# Patient Record
Sex: Male | Born: 1980 | Race: Black or African American | Hispanic: No | Marital: Single | Smoking: Current every day smoker
Health system: Southern US, Community
[De-identification: ages and names within clinical notes are randomized; demographics above are authoritative.]

## PROBLEM LIST (undated history)

## (undated) HISTORY — PX: FOREIGN BODY REMOVAL: SHX962

---

## 2010-05-02 ENCOUNTER — Ambulatory Visit (HOSPITAL_COMMUNITY): Admission: RE | Admit: 2010-05-02 | Discharge: 2010-05-02 | Payer: Self-pay | Admitting: Orthopaedic Surgery

## 2011-02-24 LAB — URINALYSIS, ROUTINE W REFLEX MICROSCOPIC
Bilirubin Urine: NEGATIVE
Hgb urine dipstick: NEGATIVE

## 2012-04-12 ENCOUNTER — Emergency Department (HOSPITAL_COMMUNITY): Payer: Self-pay

## 2012-04-12 ENCOUNTER — Emergency Department (HOSPITAL_COMMUNITY)
Admission: EM | Admit: 2012-04-12 | Discharge: 2012-04-13 | Disposition: A | Discharge status: Home / Self Care | Payer: Self-pay | Attending: Emergency Medicine | Admitting: Emergency Medicine

## 2012-04-12 ENCOUNTER — Encounter (HOSPITAL_COMMUNITY): Payer: Self-pay | Admitting: *Deleted

## 2012-04-12 DIAGNOSIS — M25469 Effusion, unspecified knee: Secondary | ICD-10-CM | POA: Insufficient documentation

## 2012-04-12 DIAGNOSIS — S8991XA Unspecified injury of right lower leg, initial encounter: Secondary | ICD-10-CM

## 2012-04-12 DIAGNOSIS — M25569 Pain in unspecified knee: Secondary | ICD-10-CM | POA: Insufficient documentation

## 2012-04-12 MED ORDER — IBUPROFEN 800 MG PO TABS
800.0000 mg | ORAL_TABLET | Freq: Once | ORAL | Status: AC
  Administered 2012-04-12: 800 mg via ORAL
  Filled 2012-04-12: qty 1

## 2012-04-12 NOTE — ED Provider Notes (Signed)
History   This chart was scribed for Sunnie Nielsen, MD by Brooks Sailors. The patient was seen in room APA18/[facility]18. Patient's care was started at 2250.   CSN: [id number]  Arrival date & time 04/12/12  2250   First MD Initiated Contact with Patient 04/12/12 2302      Chief Complaint  Patient presents with  . Knee Pain    (Consider location/radiation/quality/duration/timing/severity/associated sxs/prior treatment) Patient is a 31 y.o. male presenting with knee pain.  Knee Pain This is a new problem. The current episode started 12 to 24 hours ago. The problem occurs constantly. The problem has not changed since onset.Pertinent negatives include no chest pain, no abdominal pain and no shortness of breath. The symptoms are aggravated by walking and exertion. The symptoms are relieved by nothing. He has tried nothing for the symptoms. The treatment provided no relief.    MANNING LUNA is a 31 y.o. male who presents to the Emergency Department complaining of constant right knee pain onset 2 days ago following an injury. Patient was standing the other day and then turned when he heard a "pop" and has been swollen and sore ever since. Patient says pain is worst in the morning. Patient says the pain is sharp and that it radiates to the calf. Patient denies any previous injuries to the right knee and numbness in the RLE  History reviewed. No pertinent past medical history.  Past Surgical History  Procedure Date  . Foreign body removal     History reviewed. No pertinent family history.  History  Substance Use Topics  . Smoking status: Current Everyday Smoker -- 0.5 packs/day    Types: Cigarettes  . Smokeless tobacco: Not on file  . Alcohol Use: No      Review of Systems  Respiratory: Negative for shortness of breath.   Cardiovascular: Negative for chest pain.  Gastrointestinal: Negative for abdominal pain.  All other systems reviewed and are negative.    Allergies  Review of  patient's allergies indicates no known allergies.  Home Medications  No current outpatient prescriptions on file.  BP 126/65  Pulse 75  Temp 98.7 F (37.1 C)  Resp 18  Ht 5\' 9"  (1.753 m)  Wt 180 lb (81.647 kg)  BMI 26.58 kg/m2  SpO2 99%  Physical Exam  Nursing note and vitals reviewed. Constitutional: He is oriented to person, place, and time. He appears well-developed and well-nourished.  HENT:  Head: Normocephalic and atraumatic.  Eyes: Conjunctivae and EOM are normal. Pupils are equal, round, and reactive to light.  Neck: Normal range of motion. Neck supple.  Cardiovascular: Normal rate and regular rhythm.   Pulmonary/Chest: Effort normal and breath sounds normal.  Abdominal: Soft. Bowel sounds are normal.  Musculoskeletal: Normal range of motion. He exhibits tenderness (right knee).       Patient tender in right knee medial and lateral aspects. No joint effusion, No increased warmth, Skin intact, Non tender proximal fibula, ankle, and hip.   Neurological: He is alert and oriented to person, place, and time.  Skin: Skin is warm and dry.  Psychiatric: He has a normal mood and affect.    ED Course  Procedures (including critical care time) DIAGNOSTIC STUDIES: Oxygen Saturation is 99% on room air, normal by my interpretation.    COORDINATION OF CARE: 11:34PM Patient to get x-ray of knee, antiinflammatory, and pain medication.   Dg Knee Complete 4 Views Right  04/13/2012  *RADIOLOGY REPORT*  Clinical Data: Right-sided knee pain, status  post knee "pop".  RIGHT KNEE - COMPLETE 4+ VIEW  Comparison: None.  Findings: There is no evidence of fracture or dislocation.  The joint spaces are preserved.  No significant degenerative change is seen; the patellofemoral joint is grossly unremarkable in appearance.  A moderate knee joint effusion is noted.  The visualized soft tissues are otherwise grossly unremarkable.  IMPRESSION:  1.  No evidence of fracture or dislocation. 2.  Moderate  knee joint effusion noted.  If the patient's symptoms persist, MRI of the right knee could be considered for further evaluation, to exclude internal derangement of the knee.  Original Report Authenticated By: Tonia Ghent, M.D.   Gustavus Bryant. Pain medications. X-ray obtained and reviewed as above.  12:40 AM on recheck, pain is much improved. I offered knee joint aspiration and patient declined. He prefers to followup with orthopedics as an outpatient.  MDM  Right knee pain and swelling after twisting injury where he heard a pop. Knee effusion on x-rays above.  Knee immobilizer and crutches provided with orthopedic referral. Stable for discharge home. Mechanism does not suggest risk for tibial plateau fracture. Presentation more concerning for ligament or meniscal injury.   I personally performed the services described in this documentation, which was scribed in my presence. The recorded information has been reviewed and considered.     Sunnie Nielsen, MD 04/13/12 [phone]

## 2012-04-12 NOTE — ED Notes (Signed)
Pt reports standing and turning and then feeling knee "pop".  Pt reports injury happened 2-3 days ago.  Reports pain and swelling in right knee.  Has been treating at home with IcyHot, with no relief.  Denies taking pain medications or antiinflammatories.

## 2012-04-13 MED ORDER — IBUPROFEN 800 MG PO TABS: 800.0000 mg | ORAL_TABLET | Freq: Three times a day (TID) | ORAL | Status: AC

## 2012-04-13 NOTE — ED Notes (Signed)
Knee wrapped and crutches provided.  Crutch teaching done.  Pt reporting improvement in pain level.

## 2012-04-13 NOTE — Discharge Instructions (Signed)
Knee Effusion The medical term for having fluid in your knee is effusion. This is often due to an internal derangement of the knee. This means something is wrong inside the knee. Some of the causes of fluid in the knee may be torn cartilage, a torn ligament, or bleeding into the joint from an injury. Your knee is likely more difficult to bend and move. This is often because there is increased pain and pressure in the joint. The time it takes for recovery from a knee effusion depends on different factors, including:   Type of injury.   Your age.   Physical and medical conditions.   Rehabilitation Strategies.  How long you will be away from your normal activities will depend on what kind of knee problem you have and how much damage is present. Your knee has two types of cartilage. Articular cartilage covers the bone ends and lets your knee bend and move smoothly. Two menisci, thick pads of cartilage that form a rim inside the joint, help absorb shock and stabilize your knee. Ligaments bind the bones together and support your knee joint. Muscles move the joint, help support your knee, and take stress off the joint itself. CAUSES  Often an effusion in the knee is caused by an injury to one of the menisci. This is often a tear in the cartilage. Recovery after a meniscus injury depends on how much meniscus is damaged and whether you have damaged other knee tissue. Small tears may heal on their own with conservative treatment. Conservative means rest, limited weight bearing activity and muscle strengthening exercises. Your recovery may take up to 6 weeks.  TREATMENT  Larger tears may require surgery. Meniscus injuries may be treated during arthroscopy. Arthroscopy is a procedure in which your surgeon uses a small telescope like instrument to look in your knee. Your caregiver can make a more accurate diagnosis (learning what is wrong) by performing an arthroscopic procedure. If your injury is on the inner  margin of the meniscus, your surgeon may trim the meniscus back to a smooth rim. In other cases your surgeon will try to repair a damaged meniscus with stitches (sutures). This may make rehabilitation take longer, but may provide better long term result by helping your knee keep its shock absorption capabilities. Ligaments which are completely torn usually require surgery for repair. HOME CARE INSTRUCTIONS  Use crutches as instructed.   If a brace is applied, use as directed.   Once you are home, an ice pack applied to your swollen knee may help with discomfort and help decrease swelling.   Keep your knee raised (elevated) when you are not up and around or on crutches.   Only take over-the-counter or prescription medicines for pain, discomfort, or fever as directed by your caregiver.   Your caregivers will help with instructions for rehabilitation of your knee. This often includes strengthening exercises.   You may resume a normal diet and activities as directed.  SEEK MEDICAL CARE IF:   There is increased swelling in your knee.   You notice redness, swelling, or increasing pain in your knee.   An unexplained oral temperature above 102 F (38.9 C) develops.  SEEK IMMEDIATE MEDICAL CARE IF:   You develop a rash.   You have difficulty breathing.   You have any allergic reactions from medications you may have been given.   There is severe pain with any motion of the knee.  MAKE SURE YOU:   Understand these instructions.  Will watch your condition.   Will get help right away if you are not doing well or get worse.    Knee Wraps (Elastic Bandage) and RICE Knee wraps come in many different shapes and sizes and perform many different functions. Some wraps may provide cold therapy or warmth. Your caregiver will help you to determine what is best for your protection, or recovery following your injury. The following are some general tips to help you use a knee wrap:  Use the wrap  as directed.   Do not keep the wrap so tight that it cuts off the circulation of the leg below the wrap.   If your lower leg becomes blue, loses feeling, or becomes swollen below the wrap, it is probably too tight. Loosen the wrap as needed to improve these problems.   See your caregiver or trainer if the wrap seems to be making your problems worse rather than better.  Wraps in general help to remind you that you have an injury. They provide limited support. The few pounds of support they provide are minimal considering the hundreds of pounds of pressure it takes to injure a joint or tear ligaments.  The routine care of many injuries includes Rest, Ice, Compression, and Elevation (RICE).  Rest is required to allow your body to heal. Generally following bumps and bruises, routine activities can be resumed when comfortable. Injured tendons (cord-like structures that attach muscle to bone) and bones take approximately 6 to 12 weeks to heal.   Ice following an injury helps keep the swelling down and reduces pain. Do not apply ice directly to skin. Apply ice bags for 20-30 minutes every 3-4 hours for the first 2-3 days following injury or surgery. Place ice in a plastic bag with a towel around it.   Compression helps keep swelling down, gives support, and helps with discomfort. If a knee wrap has been applied, it should be removed and reapplied every 3 to 4 hours. It should be applied firmly enough to keep swelling down, but not too tightly. Watch your lower leg and toes for swelling, bluish discoloration, coldness, numbness or excessive pain. If any of these symptoms (problems) occur, remove the knee wrap and reapply more loosely. If these symptoms persist, contact your caregiver immediately.   Elevation helps reduce swelling, and decreases pain. With extremities (arms/hands and legs/feet), the injured area should be placed near to or above the level of the heart if possible.  Persistent pain and  inability to use the injured area for more than 2 to 3 days are warning signs indicating that you should see a caregiver for a follow-up visit as soon as possible. Initially, a hairline fracture (this is the same as a broken bone) may not be seen on x-rays.  Persistent pain and swelling mean limitedweight bearing (use of crutches as instructed) should continue. You may need further x-rays.  X-rays may not show a non-displaced fracture until a week or ten days later. Make a follow-up appointment with your caregiver. A radiologist (a specialist in reading x-rays) will re-read your x-rays. Make sure you know how to get your x-ray results. Do not assume everything is normal if you do not hear from your caregiver.

## 2016-04-22 ENCOUNTER — Emergency Department (HOSPITAL_COMMUNITY)
Admission: EM | Admit: 2016-04-22 | Discharge: 2016-04-22 | Disposition: A | Discharge status: Home / Self Care | Payer: Self-pay | Attending: Emergency Medicine | Admitting: Emergency Medicine

## 2016-04-22 ENCOUNTER — Encounter (HOSPITAL_COMMUNITY): Payer: Self-pay

## 2016-04-22 DIAGNOSIS — Z23 Encounter for immunization: Secondary | ICD-10-CM | POA: Insufficient documentation

## 2016-04-22 DIAGNOSIS — Y999 Unspecified external cause status: Secondary | ICD-10-CM | POA: Insufficient documentation

## 2016-04-22 DIAGNOSIS — Y929 Unspecified place or not applicable: Secondary | ICD-10-CM | POA: Insufficient documentation

## 2016-04-22 DIAGNOSIS — X17XXXA Contact with hot engines, machinery and tools, initial encounter: Secondary | ICD-10-CM | POA: Insufficient documentation

## 2016-04-22 DIAGNOSIS — F17210 Nicotine dependence, cigarettes, uncomplicated: Secondary | ICD-10-CM | POA: Insufficient documentation

## 2016-04-22 DIAGNOSIS — T24201A Burn of second degree of unspecified site of right lower limb, except ankle and foot, initial encounter: Secondary | ICD-10-CM | POA: Insufficient documentation

## 2016-04-22 DIAGNOSIS — Y9389 Activity, other specified: Secondary | ICD-10-CM | POA: Insufficient documentation

## 2016-04-22 MED ORDER — TETANUS-DIPHTH-ACELL PERTUSSIS 5-2.5-18.5 LF-MCG/0.5 IM SUSP
0.5000 mL | Freq: Once | INTRAMUSCULAR | Status: AC
  Administered 2016-04-22: 0.5 mL via INTRAMUSCULAR
  Filled 2016-04-22: qty 0.5
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
## 2016-04-22 NOTE — Discharge Instructions (Signed)
Please cleanse your wound with soap and water. Apply a dressing until the burn wound has healed. Please see your primary physician, or return to the emergency department if any signs of infection. Second-Degree Burn A second-degree burn affects the 2 outer layers of skin. The outer layer (epidermis) and the layer underneath it (dermis) are both burned. Another name for this type of burn is a partial thickness burn. A second-degree burn may be called minor or major. This depends on the size of the burn. It also depends on what parts of the skin are burned. Minor burns may be treated with first aid. Major burns are a medical emergency. A second-degree burn is worse than a first-degree burn, but not as bad as a third-degree burn. A first-degree burn affects only the epidermis. A third-degree burn goes through all the layers of skin. A second-degree burn usually heals in 3 to 4 weeks. A minor second-degree burn usually does not leave a scar.Deeper second-degree burns may lead to scarring of the skin or contractures over joints.Contractures are scars that form over joints and may lead to reduced mobility at those joints. CAUSES  Heat (thermal) injury. This happens when skin comes in contact with something very hot. It could be a flame, a hot object, hot liquid, or steam. Most second-degree burns are thermal injuries.  Radiation. Sunlight is one type of radiation that can burn the skin. Another type of radiation is used to heat food. Radiation is also used to treat some diseases, such as cancer. All types of radiation can burn the skin. Sunlight usually causes a first-degree burn. Radiation used for heating food or treating a disease can cause a second-degree burn.  Electricity. Electrical burns can cause more damage under the skin than on the surface. They should always be treated as major burns.  Chemicals. Many chemicals can burn the skin. The burn should be flushed with cool water and checked by an  emergency caregiver. SYMPTOMS Symptoms of second-degree burns include:  Severe pain.  Extreme tenderness.  Deep redness.  Blistered skin.  Skin that has changed color.It might look blotchy, wet, or shiny.  Swelling. TREATMENT Some second-degree burns may need to be treated in a hospital. These include major burns, electrical burns, and chemical burns. Many other second-degree burns can be treated with regular first aid, such as:  Cooling the burn. Use cool, germ-free (sterile) salt water. Place the burned area of skin into a tub of water, or cover the burned area with clean, wet towels.  Taking pain medicine.  Removing the dead skin from broken blisters. A trained caregiver may do this. Do not pop blisters.  Gently washing your skin with mild soap.  Covering the burned area with a cream.Silver sulfadiazine is a cream for burns. An antibiotic cream, such as bacitracin, may also be used to fight infection. Do not use other ointments or creams unless your caregiver says it is okay.  Protecting the burn with a sterile, non-sticky bandage.  Bandaging fingers and toes separately. This keeps them from sticking together.  Taking an antibiotic. This can help prevent infection.  Getting a tetanus shot. HOME CARE INSTRUCTIONS Medication  Take any medicine prescribed by your caregiver. Follow the directions carefully.  Ask your caregiver if you can take over-the-counter medicine to relieve pain and swelling. Do not give aspirin to children.  Make sure your caregiver knows about all other medicines you take.This includes over-the-counter medicines. Burn care  You will need to change the bandage on  your burn. You may need to do this 2 or 3 times each day.  Gently clean the burned area.  Put ointment on it.  Cover the burn with a sterile bandage.  For some deeper burns or burns that cover a large area, compression garments may be prescribed. These garments can help minimize  scarring and protect your mobility.  Do not put butter or oil on your skin. Use only the cream prescribed by your caregiver.  Do not put ice on your burn.  Do not break blisters on your skin.  Keep the bandaged area dry. You might need to take a sponge bath for awhile.Ask your caregiver when you can take a shower or a tub bath again.  Do not scratch an itchy burn. Your caregiver may give you medicine to relieve very bad itching.  Infection is a big danger after a second-degree burn. Tell your caregiver right away if you have signs of infection, such as:  Redness or changing color in the burned area.  Fluid leaking from the burn.  Swelling in the burn area.  A bad smell coming from the wound. Follow-up  Keep all follow-up appointments.This is important. This is how your caregiver can tell if your treatment is working.  Protect your burn from sunlight.Use sunscreen whenever you go outside.Burned areas may be sensitive to the sun for up to 1 year. Exposure to the sun may also cause permanent darkening of scars. SEEK MEDICAL CARE IF:  You have any questions about medicines.  You have any questions about your treatment.  You wonder if it is okay to do a particular activity.  You develop a fever of more than 100.5 F (38.1 C). SEEK IMMEDIATE MEDICAL CARE IF:  You think your burn might be infected. It may change color, become red, leak fluid, swell, or smell bad.  You develop a fever of more than 102 F (38.9 C).   This information is not intended to replace advice given to you by your health care provider. Make sure you discuss any questions you have with your health care provider.   Document Released: 04/28/2011 Document Revised: 02/16/2012 Document Reviewed: 04/28/2011 Elsevier Interactive Patient Education [employer]2016 [employer]
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
## 2019-01-02 ENCOUNTER — Emergency Department (HOSPITAL_COMMUNITY)
Admission: EM | Admit: 2019-01-02 | Discharge: 2019-01-02 | Disposition: A | Discharge status: Home / Self Care | Payer: 59 | Attending: Emergency Medicine | Admitting: Emergency Medicine

## 2019-01-02 ENCOUNTER — Encounter (HOSPITAL_COMMUNITY): Payer: Self-pay | Admitting: Emergency Medicine

## 2019-01-02 ENCOUNTER — Other Ambulatory Visit: Payer: Self-pay

## 2019-01-02 DIAGNOSIS — K529 Noninfective gastroenteritis and colitis, unspecified: Secondary | ICD-10-CM | POA: Diagnosis not present

## 2019-01-02 DIAGNOSIS — R112 Nausea with vomiting, unspecified: Secondary | ICD-10-CM

## 2019-01-02 DIAGNOSIS — F17210 Nicotine dependence, cigarettes, uncomplicated: Secondary | ICD-10-CM | POA: Insufficient documentation

## 2019-01-02 DIAGNOSIS — R197 Diarrhea, unspecified: Secondary | ICD-10-CM

## 2019-01-02 LAB — COMPREHENSIVE METABOLIC PANEL
ALT: 32 U/L (ref 0–44)
AST: 32 U/L (ref 15–41)
Albumin: 4.6 g/dL (ref 3.5–5.0)
Alkaline Phosphatase: 53 U/L (ref 38–126)
Anion gap: 12 (ref 5–15)
BUN: 17 mg/dL (ref 6–20)
CHLORIDE: 106 mmol/L (ref 98–111)
CO2: 23 mmol/L (ref 22–32)
Calcium: 9.6 mg/dL (ref 8.9–10.3)
Creatinine, Ser: 1.37 mg/dL — ABNORMAL HIGH (ref 0.61–1.24)
GFR calc Af Amer: >60 mL/min (ref >60)
GLUCOSE: 160 mg/dL — AB (ref 70–99)
POTASSIUM: 4.5 mmol/L (ref 3.5–5.1)
SODIUM: 141 mmol/L (ref 135–145)
Total Bilirubin: 1.1 mg/dL (ref 0.3–1.2)
Total Protein: 8.5 g/dL — ABNORMAL HIGH (ref 6.5–8.1)

## 2019-01-02 LAB — CBC
HCT: 52.1 % — ABNORMAL HIGH (ref 39.0–52.0)
Hemoglobin: 16.1 g/dL (ref 13.0–17.0)
MCH: 27.8 pg (ref 26.0–34.0)
MCHC: 30.9 g/dL (ref 30.0–36.0)
MCV: 90 fL (ref 80.0–100.0)
PLATELETS: 173 10*3/uL (ref 150–400)
RBC: 5.79 MIL/uL (ref 4.22–5.81)
RDW: 12.9 % (ref 11.5–15.5)
WBC: 10.7 10*3/uL — AB (ref 4.0–10.5)
nRBC: 0 % (ref 0.0–0.2)

## 2019-01-02 LAB — LIPASE, BLOOD: LIPASE: 27 U/L (ref 11–51)

## 2019-01-02 MED ORDER — ONDANSETRON 4 MG PO TBDP: 4.0000 mg | ORAL_TABLET | Freq: Three times a day (TID) | ORAL | 1 refills | Status: AC | PRN

## 2019-01-02 MED ORDER — ONDANSETRON HCL 4 MG/2ML IJ SOLN: 4.0000 mg | Freq: Once | INTRAMUSCULAR | Status: DC | PRN

## 2019-01-02 MED ORDER — LOPERAMIDE HCL 2 MG PO CAPS
4.0000 mg | ORAL_CAPSULE | Freq: Once | ORAL | Status: AC
  Administered 2019-01-02: 4 mg via ORAL
  Filled 2019-01-02: qty 2

## 2019-01-02 MED ORDER — METOCLOPRAMIDE HCL 5 MG/ML IJ SOLN
5.0000 mg | Freq: Once | INTRAMUSCULAR | Status: AC
  Administered 2019-01-02: 5 mg via INTRAVENOUS
  Filled 2019-01-02: qty 2

## 2019-01-02 MED ORDER — ONDANSETRON HCL 4 MG/2ML IJ SOLN
4.0000 mg | Freq: Once | INTRAMUSCULAR | Status: AC
  Administered 2019-01-02: 4 mg via INTRAVENOUS
  Filled 2019-01-02: qty 2

## 2019-01-02 MED ORDER — SODIUM CHLORIDE 0.9% FLUSH: 3.0000 mL | Freq: Once | INTRAVENOUS | Status: DC

## 2019-01-02 MED ORDER — SODIUM CHLORIDE 0.9 % IV BOLUS
2000.0000 mL | Freq: Once | INTRAVENOUS | Status: AC
  Administered 2019-01-02: 2000 mL via INTRAVENOUS

## 2019-01-02 NOTE — ED Provider Notes (Signed)
Patient feeling much better after the fluids.  Heart rate came down below 100.  The prolonged QT resolved itself.  Patient given a dose of Zofran here prior to discharge and will be given prescription for ODT Zofran.  Work note provided.  He will return for any new or worse symptoms.  Patient nontoxic.  Symptoms seem to be consistent with a gastroenteritis.   Vanetta Mulders, MD 01/02/19 1827

## 2019-01-02 NOTE — ED Provider Notes (Signed)
[facility] EMERGENCY DEPARTMENT Provider Note   CSN: [id number] Arrival date & time: 01/02/19  1344     History   Chief Complaint Chief Complaint  Patient presents with  . Emesis    HPI Jack Morton is a 38 y.o. male.  Complains of vomiting and diarrhea onset last night he reports approximately 10 episodes of vomiting and 10 episodes of diarrhea since last night.  Other associated symptoms include diffuse myalgias.  Maximum temperature 100.  He admits to feeling lightheaded and thirsty.  Denies abdominal pain.  Denies blood per rectum or hematemesis, stating both emesis and diarrhea are watery.  Nothing makes symptoms better or worse.  No treatment prior to coming here.  No other associated symptoms  HPI  History reviewed. No pertinent past medical history.  There are no active problems to display for this patient.   Past Surgical History:  Procedure Laterality Date  . FOREIGN BODY REMOVAL          Home Medications    Prior to Admission medications   Not on File    Family History No family history on file.  Social History Social History   Tobacco Use  . Smoking status: Current Every Day Smoker    Packs/day: 0.50    Types: Cigarettes  . Smokeless tobacco: Never Used  Substance Use Topics  . Alcohol use: No  . Drug use: No     Allergies   Patient has no known allergies.   Review of Systems Review of Systems  Constitutional: Negative.   HENT: Negative.   Respiratory: Negative.   Cardiovascular: Positive for chest pain.       Syncope  Gastrointestinal: Positive for diarrhea, nausea and vomiting.  Musculoskeletal: Positive for myalgias.  Skin: Negative.   Allergic/Immunologic: Negative.   Neurological: Positive for light-headedness.  Psychiatric/Behavioral: Negative.   All other systems reviewed and are negative.    Physical Exam Updated Vital Signs BP 126/83 (BP Location: Right Arm)   Pulse (S) (!) 144   Temp 98.4 F (36.9 C) (Oral)    Resp 18   Ht 5\' 9"  (1.753 m)   Wt 79.4 kg   SpO2 96%   BMI 25.84 kg/m   Physical Exam Vitals signs and nursing note reviewed.  Constitutional:      Appearance: Normal appearance. He is well-developed. He is not ill-appearing.  HENT:     Head: Normocephalic and atraumatic.     Comments: Mucous membranes dry Eyes:     Conjunctiva/sclera: Conjunctivae normal.     Pupils: Pupils are equal, round, and reactive to light.  Neck:     Musculoskeletal: Neck supple.     Thyroid: No thyromegaly.     Trachea: No tracheal deviation.  Cardiovascular:     Rate and Rhythm: Regular rhythm. Tachycardia present.     Heart sounds: No murmur.     Comments: Heart rate counted at 112 bpm by me Pulmonary:     Effort: Pulmonary effort is normal.     Breath sounds: Normal breath sounds.  Abdominal:     General: Bowel sounds are normal. There is no distension.     Palpations: Abdomen is soft.     Tenderness: There is no abdominal tenderness.  Genitourinary:    Penis: Normal.      Scrotum/Testes: Normal.  Musculoskeletal: Normal range of motion.        General: No tenderness.  Skin:    General: Skin is warm and dry.  Findings: No rash.  Neurological:     Mental Status: He is alert.     Coordination: Coordination normal.  Psychiatric:        Mood and Affect: Mood normal.      ED Treatments / Results  Labs (all labs ordered are listed, but only abnormal results are displayed) Labs Reviewed  LIPASE, BLOOD  COMPREHENSIVE METABOLIC PANEL  CBC    EKG EKG Interpretation  Date/Time:  Sunday January 02 2019 14:18:04 EST Ventricular Rate:  136 PR Interval:  142 QRS Duration: 74 QT Interval:  368 QTC Calculation: 553 R Axis:   94 Text Interpretation:   Critical Test Result: Long QTc Sinus tachycardia Biatrial enlargement Rightward axis Abnormal ECG No old tracing to compare Confirmed by Prescott, Doreatha Martin [phone]) on 01/02/2019 2:42:49 PM   Radiology No results  found.  Procedures Procedures (including critical care time)  Medications Ordered in ED Medications  sodium chloride flush (NS) 0.9 % injection 3 mL (has no administration in time range)  sodium chloride 0.9 % bolus 2,000 mL (has no administration in time range)  metoCLOPramide (REGLAN) injection 5 mg (has no administration in time range)  loperamide (IMODIUM) capsule 4 mg (has no administration in time range)   Results for orders placed or performed during the hospital encounter of 01/02/19  CBC  Result Value Ref Range   WBC 10.7 (H) 4.0 - 10.5 K/uL   RBC 5.79 4.22 - 5.81 MIL/uL   Hemoglobin 16.1 13.0 - 17.0 g/dL   HCT 05.1 (H) 10.2 - 11.1 %   MCV 90.0 80.0 - 100.0 fL   MCH 27.8 26.0 - 34.0 pg   MCHC 30.9 30.0 - 36.0 g/dL   RDW 73.5 67.0 - 14.1 %   Platelets 173 150 - 400 K/uL   nRBC 0.0 0.0 - 0.2 %   No results found. CBC unremarkable Initial Impression / Assessment and Plan / ED Course  I have reviewed the triage vital signs and the nursing notes.  Pertinent labs & imaging results that were available during my care of the patient were reviewed by me and considered in my medical decision making (see chart for details).    305 pm pt resting comfortably  pt signed out to Dr. Deretha Emory at 3 10 pm, who will check  further lab work and reassess p  Final Clinical Impressions(s) / ED Diagnoses  Diagnosis #1nausea vomiting diarrhea #2 dehydration Final diagnoses:  None    ED Discharge Orders    None       Doug Sou, MD 01/02/19 1521

## 2019-01-02 NOTE — ED Triage Notes (Signed)
Patient c/o generalized body aches, nausea, vomiting, and diarrhea that started last night. Denies any fevers.  Denies taking any medication for pain or vomiting.

## 2019-01-02 NOTE — Discharge Instructions (Addendum)
Work note provided.  Take Zofran as needed for additional vomiting.  Your repeat EKG had correction of the prolonged QT.  But expect improvement over the next few days.  As you start eating more food you may have some increased diarrhea.  Return for any new or worse symptoms to include persistent vomiting.

## 2019-07-04 ENCOUNTER — Other Ambulatory Visit: Payer: 59

## 2019-07-04 ENCOUNTER — Other Ambulatory Visit: Payer: Self-pay

## 2019-07-04 DIAGNOSIS — Z20822 Contact with and (suspected) exposure to covid-19: Secondary | ICD-10-CM

## 2019-07-06 LAB — NOVEL CORONAVIRUS, NAA: SARS-CoV-2, NAA: NOT DETECTED

## 2019-07-12 ENCOUNTER — Telehealth: Payer: Self-pay | Admitting: General Practice

## 2019-07-12 NOTE — Telephone Encounter (Signed)
Informed pt of negative covid result   °

## 2020-05-19 ENCOUNTER — Other Ambulatory Visit: Payer: Self-pay

## 2020-05-19 ENCOUNTER — Encounter (HOSPITAL_COMMUNITY): Payer: Self-pay | Admitting: *Deleted

## 2020-05-19 ENCOUNTER — Emergency Department (HOSPITAL_COMMUNITY)
Admission: EM | Admit: 2020-05-19 | Discharge: 2020-05-19 | Disposition: A | Discharge status: Home / Self Care | Payer: 59 | Attending: Emergency Medicine | Admitting: Emergency Medicine

## 2020-05-19 DIAGNOSIS — M65051 Abscess of tendon sheath, right thigh: Secondary | ICD-10-CM | POA: Diagnosis not present

## 2020-05-19 DIAGNOSIS — R238 Other skin changes: Secondary | ICD-10-CM | POA: Diagnosis present

## 2020-05-19 DIAGNOSIS — L02214 Cutaneous abscess of groin: Secondary | ICD-10-CM | POA: Insufficient documentation

## 2020-05-19 DIAGNOSIS — F17210 Nicotine dependence, cigarettes, uncomplicated: Secondary | ICD-10-CM | POA: Insufficient documentation

## 2020-05-19 DIAGNOSIS — L02415 Cutaneous abscess of right lower limb: Secondary | ICD-10-CM

## 2020-05-19 LAB — CBG MONITORING, ED: Glucose-Capillary: 85 mg/dL (ref 70–99)

## 2020-05-19 MED ORDER — IBUPROFEN 800 MG PO TABS: 800.0000 mg | ORAL_TABLET | Freq: Three times a day (TID) | ORAL | 0 refills | Status: AC

## 2020-05-19 MED ORDER — DOXYCYCLINE HYCLATE 100 MG PO CAPS: 100.0000 mg | ORAL_CAPSULE | Freq: Two times a day (BID) | ORAL | 0 refills | Status: DC

## 2020-05-19 MED ORDER — DOXYCYCLINE HYCLATE 100 MG PO TABS
100.0000 mg | ORAL_TABLET | Freq: Once | ORAL | Status: AC
  Administered 2020-05-19: 100 mg via ORAL
  Filled 2020-05-19: qty 1

## 2020-05-19 NOTE — ED Provider Notes (Signed)
[facility] EMERGENCY DEPARTMENT Provider Note   CSN: [id number] Arrival date & time: 05/19/20  1916     History Chief Complaint  Patient presents with  . Insect Bite    Jack Morton is a 39 y.o. male.  HPI      Jack Morton is a 39 y.o. male who presents to the Emergency Department complaining of a possible spider bite to his right upper leg and right groin.  He first noticed a pimple to the areas 2 days ago, he has been applying warm wet compresses and states the areas opened up and began draining yesterday.  He complains of soreness to his lower thigh and knee with walking.  He has been cleaning the area with hydrogen peroxide and keeping it bandaged.  He notes having some intermittent chills last evening with nausea, none today.  No abdominal pain, dysuria, pain or swelling of his penis or scrotum.  He denies history of diabetes or MRSA.    History reviewed. No pertinent past medical history.  There are no problems to display for this patient.   Past Surgical History:  Procedure Laterality Date  . FOREIGN BODY REMOVAL         History reviewed. No pertinent family history.  Social History   Tobacco Use  . Smoking status: Current Every Day Smoker    Packs/day: 1.00    Types: Cigarettes  . Smokeless tobacco: Never Used  Vaping Use  . Vaping Use: Never used  Substance Use Topics  . Alcohol use: No  . Drug use: No    Home Medications Prior to Admission medications   Medication Sig Start Date End Date Taking? Authorizing Provider  ondansetron (ZOFRAN ODT) 4 MG disintegrating tablet Take 1 tablet (4 mg total) by mouth every 8 (eight) hours as needed. 01/02/19   Vanetta Mulders, MD    Allergies    Patient has no known allergies.  Review of Systems   Review of Systems  Constitutional: Negative for chills and fever.  Respiratory: Negative for chest tightness and shortness of breath.   Cardiovascular: Negative for chest pain.  Gastrointestinal: Negative  for abdominal pain, nausea and vomiting.  Genitourinary: Negative for discharge, dysuria, genital sores, penile swelling, scrotal swelling and testicular pain.  Musculoskeletal: Negative for arthralgias and joint swelling.  Skin: Negative for color change.       Boil to right thigh and groin  Neurological: Negative for weakness and numbness.  Hematological: Negative for adenopathy.    Physical Exam Updated Vital Signs BP 128/78 (BP Location: Right Arm)   Pulse 74   Temp 99.7 F (37.6 C) (Oral)   Resp 14   Ht 5\' 9"  (1.753 m)   Wt 81.6 kg   SpO2 100%   BMI 26.58 kg/m   Physical Exam Vitals and nursing note reviewed.  Constitutional:      Appearance: Normal appearance. He is not ill-appearing.  HENT:     Mouth/Throat:     Mouth: Mucous membranes are moist.  Cardiovascular:     Rate and Rhythm: Normal rate and regular rhythm.     Pulses: Normal pulses.  Pulmonary:     Effort: Pulmonary effort is normal.     Breath sounds: Normal breath sounds.  Abdominal:     General: There is no distension.     Palpations: Abdomen is soft.     Tenderness: There is no abdominal tenderness.  Musculoskeletal:        General: No tenderness.  Cervical back: Normal range of motion.     Comments: Right knee nontender on range of motion.  No edema of the joint.  Skin:    General: Skin is warm.     Capillary Refill: Capillary refill takes less than 2 seconds.     Comments: 2 to 3 cm area of induration to the distal right thigh and right groin.  Purulent material expressed manually.  Mild surrounding erythema.  No lymphangitis.  Neurological:     General: No focal deficit present.     Mental Status: He is alert.     ED Results / Procedures / Treatments   Labs (all labs ordered are listed, but only abnormal results are displayed) Labs Reviewed  CBG MONITORING, ED    EKG None  Radiology No results found.  Procedures Procedures (including critical care time)  Medications  Ordered in ED Medications  doxycycline (VIBRA-TABS) tablet 100 mg (has no administration in time range)    ED Course  I have reviewed the triage vital signs and the nursing notes.  Pertinent labs & imaging results that were available during my care of the patient were reviewed by me and considered in my medical decision making (see chart for details).    MDM Rules/Calculators/A&P                          Patient well-appearing.  Nontoxic.  Here with 2 abscesses to the right lower extremity.  Both are open and draining purulent material.  Patient is ambulatory with steady gait.  I do not feel the abscess to the right lower leg involves the joint.  Full range of motion of the knee without pain.  CBG reassuring.  Areas are currently draining purulent material, do not feel that I&D is necessary at this time.  Patient agrees to frequent warm wet compresses, antibiotics and close outpatient follow-up.  Advised to return to the ER in 1 to 2 days for recheck.  Patient agrees to this plan.   Final Clinical Impression(s) / ED Diagnoses Final diagnoses:  Abscess of right thigh  Abscess of right groin    Rx / DC Orders ED Discharge Orders    None       Bufford Lope 05/19/20 2026    Maudie Flakes, MD 05/19/20 2320

## 2020-05-19 NOTE — Discharge Instructions (Addendum)
As discussed, apply warm water compresses or warm water soaks 3-4 times a day to the affected areas.  Keep the areas bandaged.  Take the antibiotic as directed until its finished.  Return here in 1 to 2 days for recheck if your symptoms are not improving.

## 2020-05-19 NOTE — ED Triage Notes (Signed)
Pt with bite assumed it was a spider to right knee, first noted 2 days ago.  Pt c/o chills last night and mild nausea.  Pt has a bandaid on site due to some drainage since yesterday.

## 2020-05-21 ENCOUNTER — Emergency Department (HOSPITAL_COMMUNITY)
Admission: EM | Admit: 2020-05-21 | Discharge: 2020-05-21 | Disposition: A | Discharge status: Home / Self Care | Payer: 59 | Attending: Emergency Medicine | Admitting: Emergency Medicine

## 2020-05-21 ENCOUNTER — Encounter (HOSPITAL_COMMUNITY): Payer: Self-pay

## 2020-05-21 ENCOUNTER — Other Ambulatory Visit: Payer: Self-pay

## 2020-05-21 DIAGNOSIS — Z792 Long term (current) use of antibiotics: Secondary | ICD-10-CM | POA: Diagnosis not present

## 2020-05-21 DIAGNOSIS — L089 Local infection of the skin and subcutaneous tissue, unspecified: Secondary | ICD-10-CM

## 2020-05-21 DIAGNOSIS — F17210 Nicotine dependence, cigarettes, uncomplicated: Secondary | ICD-10-CM | POA: Diagnosis not present

## 2020-05-21 DIAGNOSIS — L02214 Cutaneous abscess of groin: Secondary | ICD-10-CM | POA: Diagnosis present

## 2020-05-21 DIAGNOSIS — L02415 Cutaneous abscess of right lower limb: Secondary | ICD-10-CM | POA: Diagnosis not present

## 2020-05-21 MED ORDER — BACITRACIN ZINC 500 UNIT/GM EX OINT: 1.0000 "application " | TOPICAL_OINTMENT | Freq: Two times a day (BID) | CUTANEOUS | 0 refills | Status: AC

## 2020-05-21 NOTE — Discharge Instructions (Addendum)
Currently the infection is minor and we recommend continued good wound care and warm compresses with the antibiotics that are already prescribed.  Apply the topical antibiotic ointment to further help.

## 2020-05-21 NOTE — ED Triage Notes (Signed)
Pt reports here Saturday for abscess to r knee and r groin.  Reports was told to come back today for a recheck.

## 2020-05-21 NOTE — ED Provider Notes (Signed)
[facility] Provider Note   CSN: [id number] Arrival date & time: 05/21/20  3154     History Chief Complaint  Patient presents with  . Abscess    Jack Morton is a 39 y.o. male.  HPI    40 year old male comes in a chief complaint of abscess. Patient has history of right-sided lower extremity infection for which she was seen in the ER couple of days back.  Patient was advised to come back for recheck.  He reports that he has been taking the antibiotics and applying warm compresses, resulting in drainage of pus.  He has no fevers, chills.  The wound has not gotten worse.  History reviewed. No pertinent past medical history.  There are no problems to display for this patient.   Past Surgical History:  Procedure Laterality Date  . FOREIGN BODY REMOVAL         No family history on file.  Social History   Tobacco Use  . Smoking status: Current Every Day Smoker    Packs/day: 1.00    Types: Cigarettes  . Smokeless tobacco: Never Used  Vaping Use  . Vaping Use: Never used  Substance Use Topics  . Alcohol use: No  . Drug use: No    Home Medications Prior to Admission medications   Medication Sig Start Date End Date Taking? Authorizing Provider  bacitracin ointment Apply 1 application topically 2 (two) times daily. 05/21/20   Varney Biles, MD  doxycycline (VIBRAMYCIN) 100 MG capsule Take 1 capsule (100 mg total) by mouth 2 (two) times daily. 05/19/20   Triplett, Tammy, PA-C  ibuprofen (ADVIL) 800 MG tablet Take 1 tablet (800 mg total) by mouth 3 (three) times daily. Take with food 05/19/20   Triplett, Tammy, PA-C  ondansetron (ZOFRAN ODT) 4 MG disintegrating tablet Take 1 tablet (4 mg total) by mouth every 8 (eight) hours as needed. 01/02/19   Fredia Sorrow, MD    Allergies    Patient has no known allergies.  Review of Systems   Review of Systems  Constitutional: Negative for activity change and fever.  Gastrointestinal: Negative for nausea  and vomiting.  Skin: Positive for wound.  Allergic/Immunologic: Negative for immunocompromised state.    Physical Exam Updated Vital Signs BP 128/76 (BP Location: Left Arm)   Pulse 68   Temp 98.2 F (36.8 C) (Oral)   Resp 16   Ht 5\' 9"  (1.753 m)   Wt 81 kg   SpO2 100%   BMI 26.37 kg/m   Physical Exam Vitals and nursing note reviewed.  Constitutional:      Appearance: He is well-developed.  HENT:     Head: Atraumatic.  Cardiovascular:     Rate and Rhythm: Normal rate.  Pulmonary:     Effort: Pulmonary effort is normal.  Musculoskeletal:     Cervical back: Neck supple.     Comments: Patient has indurated lesion over the right suprapatellar region and also in the right inguinal region.  There is a puncture wound over both of those lesions.  Positive edematous lesion over the inguinal region.  Mild tenderness to palpation.  No clear fluctuance appreciated.  Skin:    General: Skin is warm.  Neurological:     Mental Status: He is alert and oriented to person, place, and time.     ED Results / Procedures / Treatments   Labs (all labs ordered are listed, but only abnormal results are displayed) Labs Reviewed - No data to display  EKG None  Radiology No results found.  Procedures Ultrasound ED Soft Tissue  Date/Time: 05/21/2020 11:45 AM Performed by: Derwood Kaplan, MD Authorized by: Derwood Kaplan, MD   Procedure details:    Indications: localization of abscess and evaluate for cellulitis     Transverse view:  Visualized   Longitudinal view:  Visualized   Images: archived   Location:    Location: lower extremity     Side:  Right Findings:     abscess present    cellulitis present Comments:     Less than 1 cm focus of fluid collection noted over the suprapatellar region. Ultrasound ED Soft Tissue  Date/Time: 05/21/2020 11:45 AM Performed by: Derwood Kaplan, MD Authorized by: Derwood Kaplan, MD   Procedure details:    Indications: localization of  abscess and evaluate for cellulitis     Transverse view:  Visualized   Longitudinal view:  Visualized   Images: archived   Location:    Location: groin     Side:  Right Findings:     abscess present    cellulitis present Comments:     Less than 1 cm fluid collection noted. The heavily indurated region does not have underlying abscess pocket.   (including critical care time)  Medications Ordered in ED Medications - No data to display  ED Course  I have reviewed the triage vital signs and the nursing notes.  Pertinent labs & imaging results that were available during my care of the patient were reviewed by me and considered in my medical decision making (see chart for details).    MDM Rules/Calculators/A&P                          39 year old comes in with chief complaint of soft tissue infection. Patient has indurated lesion over the right groin and right suprapatellar region.  There is a puncture/ulcer over the lesions, no large abscess pocket appreciated.  We will add bacitracin to the regimen.  Continued expectant care with strict ER return precautions.   Final Clinical Impression(s) / ED Diagnoses Final diagnoses:  Soft tissue infection    Rx / DC Orders ED Discharge Orders         Ordered    bacitracin ointment  2 times daily     Discontinue  Reprint     05/21/20 1142           Derwood Kaplan, MD 05/21/20 1148

## 2020-09-27 ENCOUNTER — Emergency Department (HOSPITAL_COMMUNITY)
Admission: EM | Admit: 2020-09-27 | Discharge: 2020-09-28 | Disposition: A | Discharge status: Home / Self Care | Payer: 59 | Attending: Emergency Medicine | Admitting: Emergency Medicine

## 2020-09-27 ENCOUNTER — Other Ambulatory Visit: Payer: Self-pay

## 2020-09-27 ENCOUNTER — Encounter (HOSPITAL_COMMUNITY): Payer: Self-pay | Admitting: *Deleted

## 2020-09-27 DIAGNOSIS — L0291 Cutaneous abscess, unspecified: Secondary | ICD-10-CM

## 2020-09-27 DIAGNOSIS — F17210 Nicotine dependence, cigarettes, uncomplicated: Secondary | ICD-10-CM | POA: Insufficient documentation

## 2020-09-27 DIAGNOSIS — R2233 Localized swelling, mass and lump, upper limb, bilateral: Secondary | ICD-10-CM | POA: Diagnosis present

## 2020-09-27 DIAGNOSIS — L02412 Cutaneous abscess of left axilla: Secondary | ICD-10-CM | POA: Diagnosis not present

## 2020-09-27 DIAGNOSIS — L02411 Cutaneous abscess of right axilla: Secondary | ICD-10-CM | POA: Insufficient documentation

## 2020-09-27 NOTE — ED Triage Notes (Signed)
Pt with abscess to right axillary for a week, drained some last night after warm compresses and again last night after his girlfriend squeezed on area some.

## 2020-09-28 MED ORDER — DOXYCYCLINE HYCLATE 100 MG PO CAPS: 100.0000 mg | ORAL_CAPSULE | Freq: Two times a day (BID) | ORAL | 0 refills | Status: AC

## 2020-09-28 NOTE — ED Provider Notes (Signed)
[facility] EMERGENCY DEPARTMENT Provider Note   CSN: [id number] Arrival date & time: 09/27/20  2109     History Chief Complaint  Patient presents with  . Abscess    Jack Morton is a 39 y.o. male.  Patient is a 39 year old male with no significant past medical history.  He presents today with complaints of pain and swelling under both axilla.  This is worsened over the past several days.  1 area has been draining.  He denies any fevers or chills.  Symptoms improved somewhat with soaking and squeezing.  Pain worse with palpation and movement.  The history is provided by the patient.       History reviewed. No pertinent past medical history.  There are no problems to display for this patient.   Past Surgical History:  Procedure Laterality Date  . FOREIGN BODY REMOVAL         History reviewed. No pertinent family history.  Social History   Tobacco Use  . Smoking status: Current Every Day Smoker    Packs/day: 1.00    Types: Cigarettes  . Smokeless tobacco: Never Used  Vaping Use  . Vaping Use: Never used  Substance Use Topics  . Alcohol use: No  . Drug use: No    Home Medications Prior to Admission medications   Medication Sig Start Date End Date Taking? Authorizing Provider  bacitracin ointment Apply 1 application topically 2 (two) times daily. 05/21/20   Derwood Kaplan, MD  doxycycline (VIBRAMYCIN) 100 MG capsule Take 1 capsule (100 mg total) by mouth 2 (two) times daily. 05/19/20   Triplett, Tammy, PA-C  ibuprofen (ADVIL) 800 MG tablet Take 1 tablet (800 mg total) by mouth 3 (three) times daily. Take with food 05/19/20   Triplett, Tammy, PA-C  ondansetron (ZOFRAN ODT) 4 MG disintegrating tablet Take 1 tablet (4 mg total) by mouth every 8 (eight) hours as needed. 01/02/19   Vanetta Mulders, MD    Allergies    Patient has no known allergies.  Review of Systems   Review of Systems  All other systems reviewed and are negative.   Physical Exam Updated  Vital Signs BP (!) 166/107 (BP Location: Left Arm)   Pulse 76   Temp 98.2 F (36.8 C) (Oral)   Resp 16   Ht 5\' 9"  (1.753 m)   Wt 81.6 kg   SpO2 98%   BMI 26.58 kg/m   Physical Exam Vitals and nursing note reviewed.  Constitutional:      General: He is not in acute distress.    Appearance: Normal appearance. He is not ill-appearing.  HENT:     Head: Normocephalic and atraumatic.  Pulmonary:     Effort: Pulmonary effort is normal.  Musculoskeletal:        General: Normal range of motion.  Skin:    General: Skin is warm and dry.     Comments: There are small, less than 1 cm, indurated areas to both axillae.  Under the right arm, there is an area that is draining purulent material.  Neurological:     Mental Status: He is alert.     ED Results / Procedures / Treatments   Labs (all labs ordered are listed, but only abnormal results are displayed) Labs Reviewed - No data to display  EKG None  Radiology No results found.  Procedures Procedures (including critical care time)  Medications Ordered in ED Medications - No data to display  ED Course  I have reviewed the  triage vital signs and the nursing notes.  Pertinent labs & imaging results that were available during my care of the patient were reviewed by me and considered in my medical decision making (see chart for details).    MDM Rules/Calculators/A&P  Patient with small abscesses in both axillae.  This will be treated with doxycycline, warm compresses and as needed return.  There are no definitive fluctuant lesions at this point that I feel require incision and drainage.  Final Clinical Impression(s) / ED Diagnoses Final diagnoses:  None    Rx / DC Orders ED Discharge Orders    None       Geoffery Lyons, MD 09/28/20 [phone]

## 2020-09-28 NOTE — Discharge Instructions (Signed)
Begin taking doxycycline as prescribed. ° °Apply warm compresses as frequently as possible for the next several days. ° °Return to the emergency department if symptoms significantly worsen or change. °

## 2021-04-19 ENCOUNTER — Emergency Department (HOSPITAL_COMMUNITY)
Admission: EM | Admit: 2021-04-19 | Discharge: 2021-04-19 | Disposition: A | Discharge status: Home / Self Care | Payer: BC Managed Care – PPO | Attending: Emergency Medicine | Admitting: Emergency Medicine

## 2021-04-19 ENCOUNTER — Other Ambulatory Visit: Payer: Self-pay

## 2021-04-19 ENCOUNTER — Encounter (HOSPITAL_COMMUNITY): Payer: Self-pay | Admitting: Emergency Medicine

## 2021-04-19 ENCOUNTER — Emergency Department (HOSPITAL_COMMUNITY): Payer: BC Managed Care – PPO

## 2021-04-19 DIAGNOSIS — F17210 Nicotine dependence, cigarettes, uncomplicated: Secondary | ICD-10-CM | POA: Insufficient documentation

## 2021-04-19 DIAGNOSIS — R109 Unspecified abdominal pain: Secondary | ICD-10-CM | POA: Diagnosis present

## 2021-04-19 DIAGNOSIS — R1012 Left upper quadrant pain: Secondary | ICD-10-CM | POA: Diagnosis not present

## 2021-04-19 DIAGNOSIS — R10A2 Flank pain, left side: Secondary | ICD-10-CM

## 2021-04-19 LAB — URINALYSIS, ROUTINE W REFLEX MICROSCOPIC
Bilirubin Urine: NEGATIVE
Glucose, UA: NEGATIVE mg/dL
Hgb urine dipstick: NEGATIVE
Ketones, ur: NEGATIVE mg/dL
Leukocytes,Ua: NEGATIVE
Nitrite: NEGATIVE
Protein, ur: NEGATIVE mg/dL
Specific Gravity, Urine: 1.023 (ref 1.005–1.030)
pH: 7 (ref 5.0–8.0)

## 2021-04-19 MED ORDER — KETOROLAC TROMETHAMINE 60 MG/2ML IM SOLN
60.0000 mg | Freq: Once | INTRAMUSCULAR | Status: AC
  Administered 2021-04-19: 60 mg via INTRAMUSCULAR
  Filled 2021-04-19: qty 2

## 2021-04-19 MED ORDER — NAPROXEN 500 MG PO TABS
500.0000 mg | ORAL_TABLET | Freq: Two times a day (BID) | ORAL | 0 refills | Status: AC
Start: 2021-04-19 — End: ?

## 2021-04-19 NOTE — ED Triage Notes (Signed)
Pt c/o left sided flank pain that started in his back x 2 days ago.

## 2021-04-19 NOTE — ED Provider Notes (Signed)
[facility] EMERGENCY DEPARTMENT Provider Note   CSN: [id number] Arrival date & time: 04/19/21  0230     History Chief Complaint  Patient presents with  . Flank Pain    Jack Morton is a 40 y.o. male.  Patient is a 40 year old male with no significant past medical history.  He presents today with complaints of left flank pain.  This started approximately 2 days ago while he was sleeping.  This began in the absence of any injury or trauma.  He does lift heavy objects at work, but does not recall a specific injury.  He describes pain to the left flank and left lateral upper abdomen that is constant.  It is worse when he moves and changes position.  He denies any bowel or bladder complaints.  He denies any fevers or chills.  The history is provided by the patient.  Flank Pain This is a new problem. The current episode started 2 days ago. The problem occurs constantly. The problem has not changed since onset.The symptoms are aggravated by bending and twisting. Nothing relieves the symptoms. He has tried nothing for the symptoms.       History reviewed. No pertinent past medical history.  There are no problems to display for this patient.   Past Surgical History:  Procedure Laterality Date  . FOREIGN BODY REMOVAL         No family history on file.  Social History   Tobacco Use  . Smoking status: Current Every Day Smoker    Packs/day: 1.00    Types: Cigarettes  . Smokeless tobacco: Never Used  Vaping Use  . Vaping Use: Never used  Substance Use Topics  . Alcohol use: No  . Drug use: No    Home Medications Prior to Admission medications   Medication Sig Start Date End Date Taking? Authorizing Provider  bacitracin ointment Apply 1 application topically 2 (two) times daily. 05/21/20   Derwood Kaplan, MD  doxycycline (VIBRAMYCIN) 100 MG capsule Take 1 capsule (100 mg total) by mouth 2 (two) times daily. One po bid x 7 days 09/28/20   Geoffery Lyons, MD  ibuprofen  (ADVIL) 800 MG tablet Take 1 tablet (800 mg total) by mouth 3 (three) times daily. Take with food 05/19/20   Triplett, Tammy, PA-C  ondansetron (ZOFRAN ODT) 4 MG disintegrating tablet Take 1 tablet (4 mg total) by mouth every 8 (eight) hours as needed. 01/02/19   Vanetta Mulders, MD    Allergies    Patient has no known allergies.  Review of Systems   Review of Systems  Genitourinary: Positive for flank pain.  All other systems reviewed and are negative.   Physical Exam Updated Vital Signs BP 121/76 (BP Location: Left Arm)   Pulse 66   Temp 98.6 F (37 C) (Oral)   Resp 16   Ht 5\' 9"  (1.753 m)   Wt 81.6 kg   SpO2 100%   BMI 26.58 kg/m   Physical Exam Vitals and nursing note reviewed.  Constitutional:      General: He is not in acute distress.    Appearance: He is well-developed. He is not diaphoretic.  HENT:     Head: Normocephalic and atraumatic.  Cardiovascular:     Rate and Rhythm: Normal rate and regular rhythm.     Heart sounds: No murmur heard. No friction rub.  Pulmonary:     Effort: Pulmonary effort is normal. No respiratory distress.     Breath sounds: Normal breath sounds.  No wheezing or rales.  Abdominal:     General: Bowel sounds are normal. There is no distension.     Palpations: Abdomen is soft.     Tenderness: There is no abdominal tenderness. There is right CVA tenderness and left CVA tenderness. There is no guarding or rebound.  Musculoskeletal:        General: Normal range of motion.     Cervical back: Normal range of motion and neck supple.  Skin:    General: Skin is warm and dry.  Neurological:     Mental Status: He is alert and oriented to person, place, and time.     Coordination: Coordination normal.     ED Results / Procedures / Treatments   Labs (all labs ordered are listed, but only abnormal results are displayed) Labs Reviewed - No data to display  EKG None  Radiology No results found.  Procedures Procedures   Medications  Ordered in ED Medications  ketorolac (TORADOL) injection 60 mg (has no administration in time range)    ED Course  I have reviewed the triage vital signs and the nursing notes.  Pertinent labs & imaging results that were available during my care of the patient were reviewed by me and considered in my medical decision making (see chart for details).    MDM Rules/Calculators/A&P  Patient presenting with complaints of left flank and left upper quadrant pain that started 2 days ago.  This began in the absence of any injury or trauma.  His pain is worse when he moves and when he pushes on the area.  He denies any difficulty breathing.  On exam, there is tenderness to the left lateral upper abdomen and flank.  His urinalysis is clear, vital signs are stable, and CT scan shows no evidence for renal calculus or other intra-abdominal process.  I highly suspect a musculoskeletal etiology.  Patient to be discharged with anti-inflammatory medication and follow-up with primary doctor if not improving.  Final Clinical Impression(s) / ED Diagnoses Final diagnoses:  None    Rx / DC Orders ED Discharge Orders    None       Geoffery Lyons, MD 04/19/21 [phone]

## 2021-04-19 NOTE — Discharge Instructions (Signed)
Begin taking naproxen as prescribed.  Follow-up with your primary doctor if symptoms are not improving in the next week. 

## 2021-10-18 IMAGING — CT CT RENAL STONE PROTOCOL
2 of 4 series · 16 of 46 positions shown, 18 images · non-contrast
Comparison: None.

CLINICAL DATA: Flank pain.  Left-sided.  Kidney stone suspected.

EXAM:
CT ABDOMEN AND PELVIS WITHOUT CONTRAST
TECHNIQUE: Multidetector CT imaging of the abdomen and pelvis was performed
following the standard protocol without IV contrast.

[Series 2: axial st · axial · 0.76mm/px · z∈[-561,-146]mm · 13 of 95 slices shown, 15 images]
[im 6/95  soft-tissue]
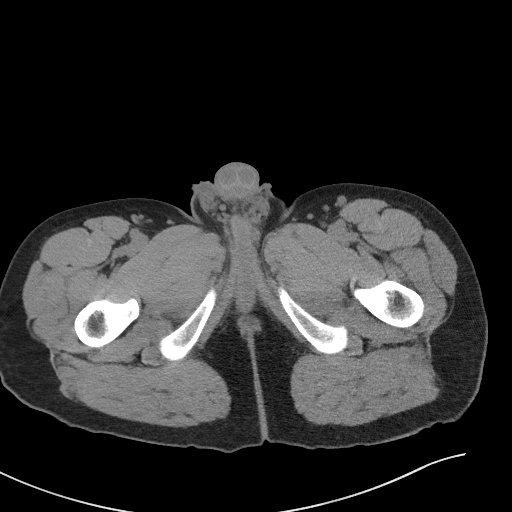
[im 6/95  bone]
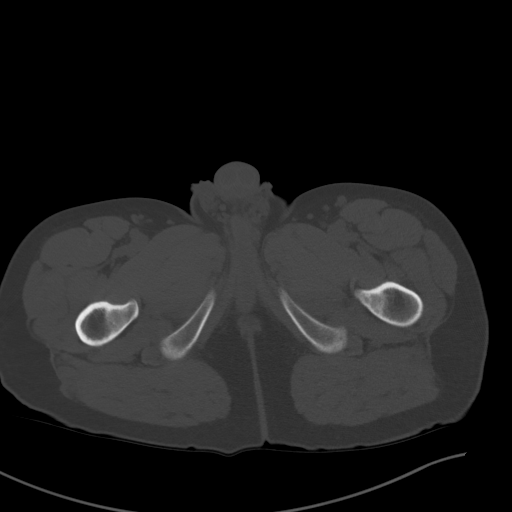
[im 12/95  soft-tissue]
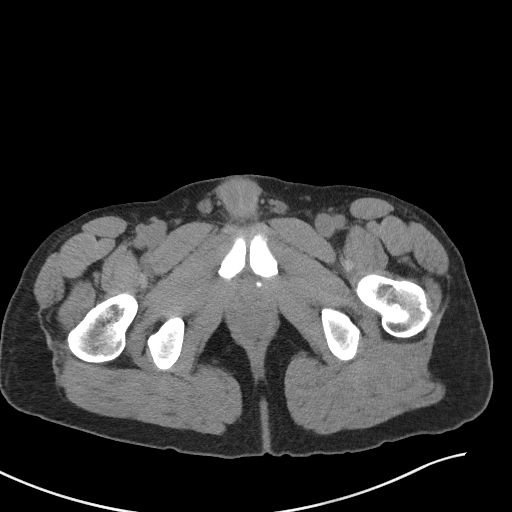
[im 23/95  soft-tissue]
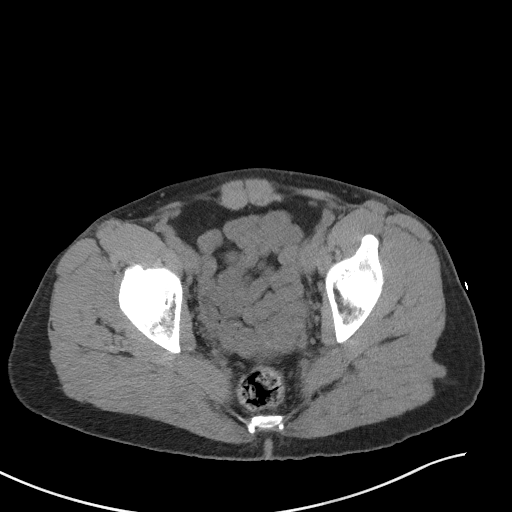
[im 28/95  soft-tissue]
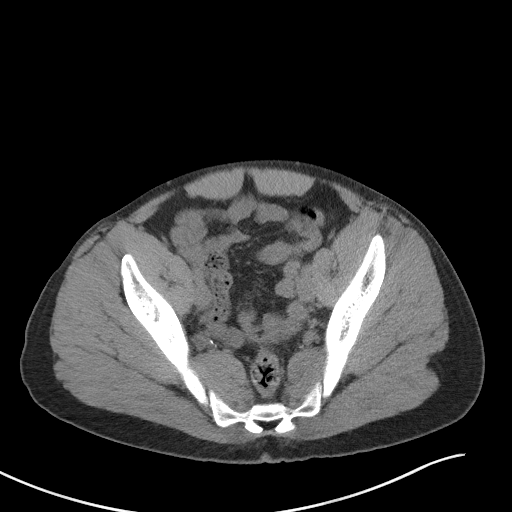
[im 34/95  soft-tissue]
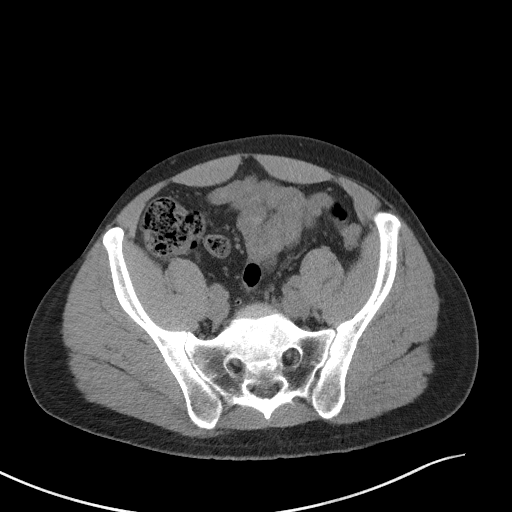
[im 39/95  soft-tissue]
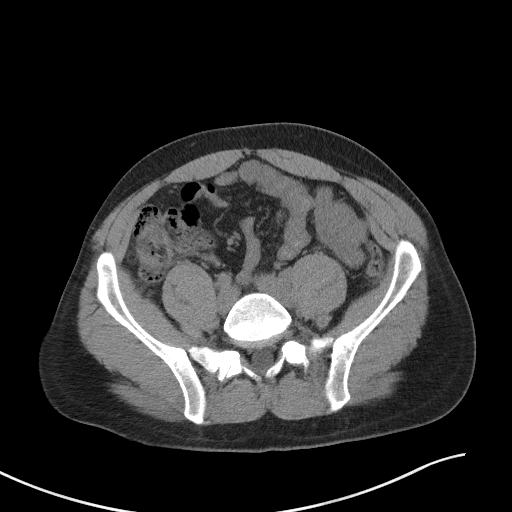
[im 50/95  soft-tissue]
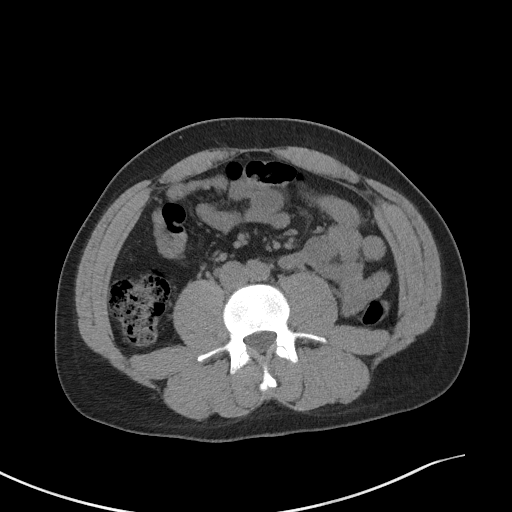
[im 56/95  soft-tissue]
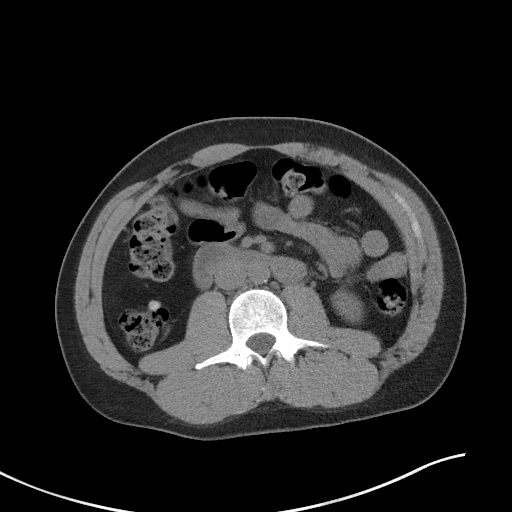
[im 61/95  soft-tissue]
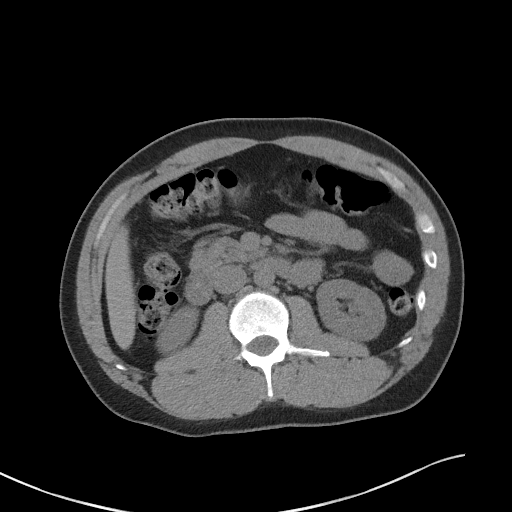
[im 61/95  bone]
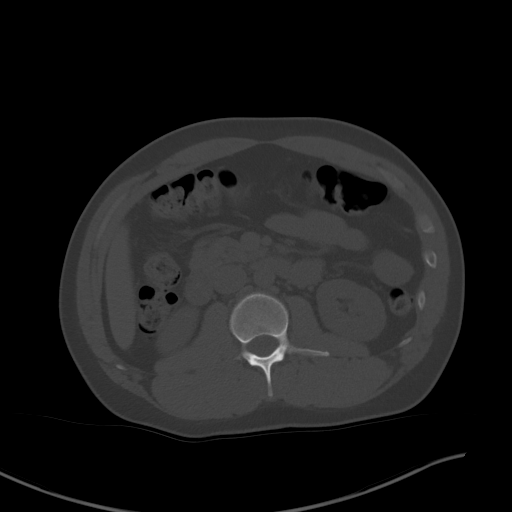
[im 67/95  soft-tissue]
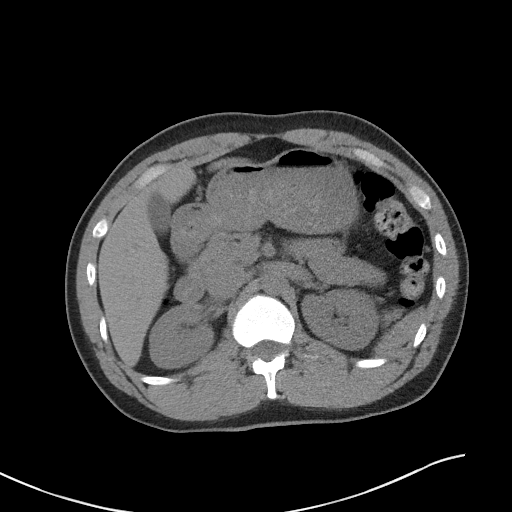
[im 72/95  soft-tissue]
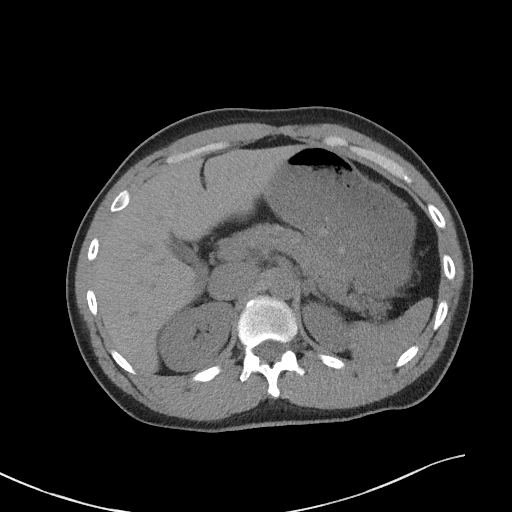
[im 83/95  soft-tissue]
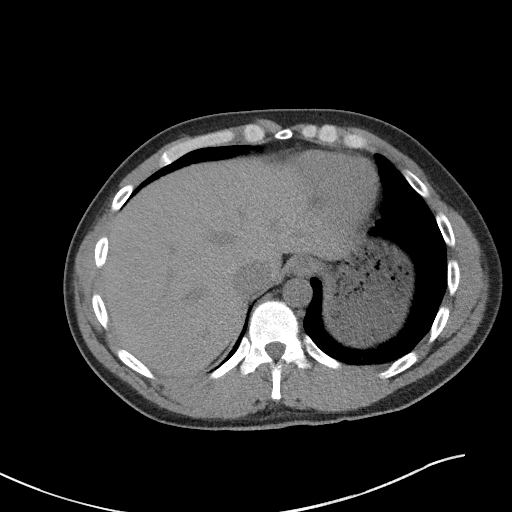
[im 89/95  soft-tissue]
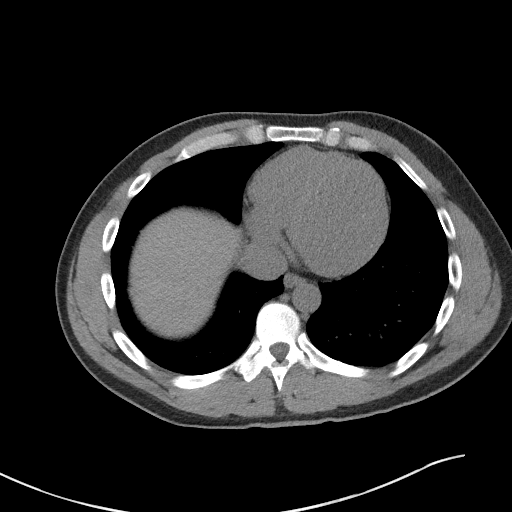

[Series 5: coronal st · coronal · 0.74mm/px · 3 of 87 slices shown]
[im 29/87  soft-tissue]
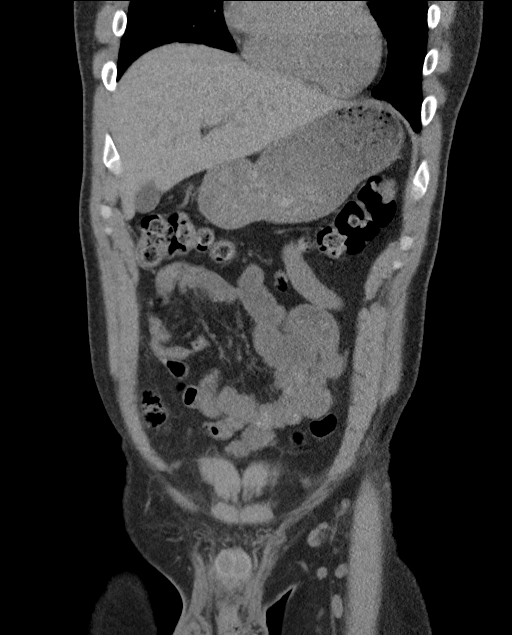
[im 39/87  soft-tissue]
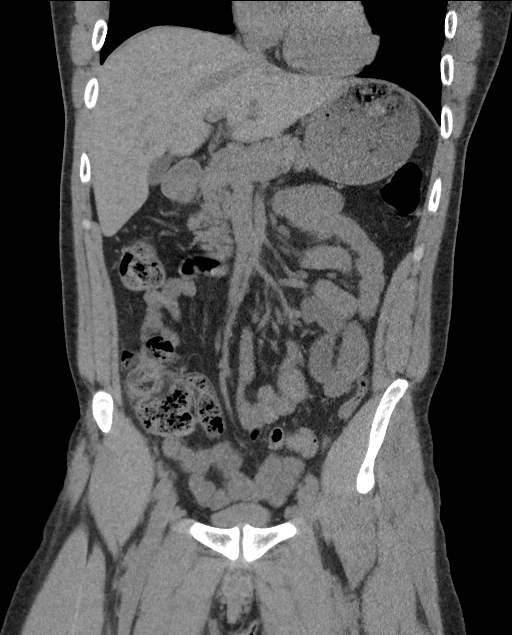
[im 48/87  soft-tissue]
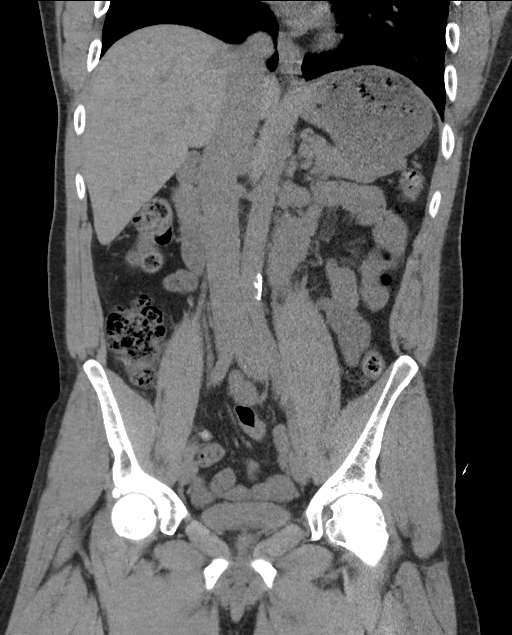

[16 of 46 positions shown; findings below may reference images not displayed]

FINDINGS: Lower chest: Bilateral lower lobe subsegmental atelectasis.

Hepatobiliary: No focal liver abnormality. No gallstones,
gallbladder wall thickening, or pericholecystic fluid. No biliary
dilatation.

Pancreas: No focal lesion. Normal pancreatic contour. No surrounding
inflammatory changes. No main pancreatic ductal dilatation.

Spleen: Normal in size without focal abnormality.

Adrenals/Urinary Tract:

No adrenal nodule bilaterally.

No nephrolithiasis, no hydronephrosis, and no contour-deforming
renal mass. No ureterolithiasis or hydroureter.

The urinary bladder is unremarkable.

Stomach/Bowel: Stomach is within normal limits. No evidence of bowel
wall thickening or dilatation. Fecalized material within the
terminal ileum likely due to an incompetent ileocecal valve or slow
transition state. Appendix appears normal.

Vascular/Lymphatic: No abdominal aorta or iliac aneurysm. Mild
atherosclerotic plaque of the aorta and its branches. No abdominal,
pelvic, or inguinal lymphadenopathy.

Reproductive: Prostate is unremarkable.

Other: No intraperitoneal free fluid. No intraperitoneal free gas.
No organized fluid collection.

Musculoskeletal: No acute or significant osseous findings.
IMPRESSION: No acute intra-abdominal or intrapelvic abnormality on this
noncontrast study.

## 2022-11-26 ENCOUNTER — Ambulatory Visit
Admission: EM | Admit: 2022-11-26 | Discharge: 2022-11-26 | Disposition: A | Discharge status: Home / Self Care | Payer: BC Managed Care – PPO | Attending: Family Medicine | Admitting: Family Medicine

## 2022-11-26 DIAGNOSIS — Z20828 Contact with and (suspected) exposure to other viral communicable diseases: Secondary | ICD-10-CM | POA: Insufficient documentation

## 2022-11-26 DIAGNOSIS — Z1152 Encounter for screening for COVID-19: Secondary | ICD-10-CM | POA: Diagnosis not present

## 2022-11-26 DIAGNOSIS — R509 Fever, unspecified: Secondary | ICD-10-CM | POA: Insufficient documentation

## 2022-11-26 DIAGNOSIS — J069 Acute upper respiratory infection, unspecified: Secondary | ICD-10-CM | POA: Insufficient documentation

## 2022-11-26 DIAGNOSIS — F17210 Nicotine dependence, cigarettes, uncomplicated: Secondary | ICD-10-CM | POA: Insufficient documentation

## 2022-11-26 DIAGNOSIS — R059 Cough, unspecified: Secondary | ICD-10-CM | POA: Diagnosis present

## 2022-11-26 MED ORDER — PROMETHAZINE-DM 6.25-15 MG/5ML PO SYRP: 5.0000 mL | ORAL_SOLUTION | Freq: Four times a day (QID) | ORAL | 0 refills | Status: AC | PRN

## 2022-11-26 MED ORDER — OSELTAMIVIR PHOSPHATE 75 MG PO CAPS: 75.0000 mg | ORAL_CAPSULE | Freq: Two times a day (BID) | ORAL | 0 refills | Status: AC

## 2022-11-26 NOTE — ED Triage Notes (Signed)
Pt reports , fever, dizzy, chills and sweats, body aches, headaches, coughing runny nose x 2 days. Took "orange cough meds" which brought fever down was 102.

## 2022-11-26 NOTE — ED Provider Notes (Signed)
[facility]    CSN: [id number] Arrival date & time: 11/26/22  1217      History   Chief Complaint No chief complaint on file.   HPI Jack Morton is a 41 y.o. male.   Patient presenting today with 2-day history of fever, chills, sweats, body aches, dizziness, headaches, cough, runny nose, fatigue.  Denies chest pain, shortness of breath, abdominal pain, nausea vomiting or diarrhea.  Taking DayQuil with mild temporary relief of fevers.  Recent exposures in the home to influenza.    History reviewed. No pertinent past medical history.  There are no problems to display for this patient.   Past Surgical History:  Procedure Laterality Date   FOREIGN BODY REMOVAL         Home Medications    Prior to Admission medications   Medication Sig Start Date End Date Taking? Authorizing Provider  oseltamivir (TAMIFLU) 75 MG capsule Take 1 capsule (75 mg total) by mouth every 12 (twelve) hours. 11/26/22  Yes Particia Nearing, PA-C  promethazine-dextromethorphan (PROMETHAZINE-DM) 6.25-15 MG/5ML syrup Take 5 mLs by mouth 4 (four) times daily as needed. 11/26/22  Yes Particia Nearing, PA-C  bacitracin ointment Apply 1 application topically 2 (two) times daily. 05/21/20   Derwood Kaplan, MD  doxycycline (VIBRAMYCIN) 100 MG capsule Take 1 capsule (100 mg total) by mouth 2 (two) times daily. One po bid x 7 days 09/28/20   Geoffery Lyons, MD  ibuprofen (ADVIL) 800 MG tablet Take 1 tablet (800 mg total) by mouth 3 (three) times daily. Take with food 05/19/20   Triplett, Tammy, PA-C  naproxen (NAPROSYN) 500 MG tablet Take 1 tablet (500 mg total) by mouth 2 (two) times daily with a meal. 04/19/21   Geoffery Lyons, MD  ondansetron (ZOFRAN ODT) 4 MG disintegrating tablet Take 1 tablet (4 mg total) by mouth every 8 (eight) hours as needed. 01/02/19   Vanetta Mulders, MD    Family History History reviewed. No pertinent family history.  Social History Social History    Tobacco Use   Smoking status: Every Day    Packs/day: 1.00    Types: Cigarettes   Smokeless tobacco: Never  Vaping Use   Vaping Use: Never used  Substance Use Topics   Alcohol use: No   Drug use: No     Allergies   Patient has no known allergies.   Review of Systems Review of Systems Per HPI  Physical Exam Triage Vital Signs ED Triage Vitals [11/26/22 1400]  Enc Vitals Group     BP 136/88     Pulse Rate 86     Resp 16     Temp 99.6 F (37.6 C)     Temp Source Oral     SpO2 98 %     Weight      Height      Head Circumference      Peak Flow      Pain Score 6     Pain Loc      Pain Edu?      Excl. in GC?    No data found.  Updated Vital Signs BP 136/88 (BP Location: Right Arm)   Pulse 86   Temp 99.6 F (37.6 C) (Oral)   Resp 16   SpO2 98%   Visual Acuity Right Eye Distance:   Left Eye Distance:   Bilateral Distance:    Right Eye Near:   Left Eye Near:    Bilateral Near:  Physical Exam Vitals and nursing note reviewed.  Constitutional:      Appearance: He is well-developed.  HENT:     Head: Atraumatic.     Right Ear: External ear normal.     Left Ear: External ear normal.     Nose: Rhinorrhea present.     Mouth/Throat:     Mouth: Mucous membranes are moist.     Pharynx: Posterior oropharyngeal erythema present. No oropharyngeal exudate.  Eyes:     Conjunctiva/sclera: Conjunctivae normal.     Pupils: Pupils are equal, round, and reactive to light.  Cardiovascular:     Rate and Rhythm: Normal rate and regular rhythm.  Pulmonary:     Effort: Pulmonary effort is normal. No respiratory distress.     Breath sounds: No wheezing or rales.  Musculoskeletal:        General: Normal range of motion.     Cervical back: Normal range of motion and neck supple.  Lymphadenopathy:     Cervical: No cervical adenopathy.  Skin:    General: Skin is warm and dry.  Neurological:     Mental Status: He is alert and oriented to person, place, and time.      Motor: No weakness.     Gait: Gait normal.  Psychiatric:        Behavior: Behavior normal.    UC Treatments / Results  Labs (all labs ordered are listed, but only abnormal results are displayed) Labs Reviewed  RESP PANEL BY RT-PCR (FLU A&B, COVID) [id number]    EKG   Radiology No results found.  Procedures Procedures (including critical care time)  Medications Ordered in UC Medications - No data to display  Initial Impression / Assessment and Plan / UC Course  I have reviewed the triage vital signs and the nursing notes.  Pertinent labs & imaging results that were available during my care of the patient were reviewed by me and considered in my medical decision making (see chart for details).     Vitals and exam overall reassuring today and suspicious for viral upper respiratory infection, likely influenza.  Treat with Tamiflu, Phenergan DM while awaiting respiratory panel results for confirmation.  Work note given.  Return for worsening symptoms.  Final Clinical Impressions(s) / UC Diagnoses   Final diagnoses:  Viral URI with cough  Fever, unspecified  Exposure to influenza   Discharge Instructions   None    ED Prescriptions     Medication Sig Dispense Auth. Provider   oseltamivir (TAMIFLU) 75 MG capsule Take 1 capsule (75 mg total) by mouth every 12 (twelve) hours. 10 capsule Ezreal, [place], [place]   promethazine-dextromethorphan (PROMETHAZINE-DM) 6.25-15 MG/5ML syrup Take 5 mLs by mouth 4 (four) times daily as needed. 100 mL Particia Nearing, [place]      PDMP not reviewed this encounter.   Jarred, [place], [place] 11/26/22 1437

## 2022-11-27 LAB — RESP PANEL BY RT-PCR (FLU A&B, COVID) ARPGX2
Influenza A by PCR: POSITIVE — AB
Influenza B by PCR: NEGATIVE
SARS Coronavirus 2 by RT PCR: NEGATIVE
